# Patient Record
Sex: Male | Born: 1935 | Race: White | Hispanic: No | Marital: Married | Smoking: Never smoker
Health system: Southern US, Community
[De-identification: ages and names within clinical notes are randomized; demographics above are authoritative.]

## PROBLEM LIST (undated history)

## (undated) DIAGNOSIS — E119 Type 2 diabetes mellitus without complications: Secondary | ICD-10-CM

---

## 1994-12-29 HISTORY — PX: CORONARY ARTERY BYPASS GRAFT: SHX141

## 2017-02-16 ENCOUNTER — Encounter (HOSPITAL_COMMUNITY): Payer: Self-pay

## 2017-02-16 ENCOUNTER — Emergency Department (HOSPITAL_COMMUNITY): Payer: Medicare Other

## 2017-02-16 ENCOUNTER — Emergency Department (HOSPITAL_COMMUNITY)
Admission: EM | Admit: 2017-02-16 | Discharge: 2017-02-16 | Disposition: A | Discharge status: Home / Self Care | Payer: Medicare Other | Attending: Emergency Medicine | Admitting: Emergency Medicine

## 2017-02-16 DIAGNOSIS — Z7982 Long term (current) use of aspirin: Secondary | ICD-10-CM | POA: Diagnosis not present

## 2017-02-16 DIAGNOSIS — Z794 Long term (current) use of insulin: Secondary | ICD-10-CM | POA: Diagnosis not present

## 2017-02-16 DIAGNOSIS — Z951 Presence of aortocoronary bypass graft: Secondary | ICD-10-CM | POA: Diagnosis not present

## 2017-02-16 DIAGNOSIS — R55 Syncope and collapse: Secondary | ICD-10-CM

## 2017-02-16 DIAGNOSIS — E119 Type 2 diabetes mellitus without complications: Secondary | ICD-10-CM | POA: Diagnosis not present

## 2017-02-16 DIAGNOSIS — Z79899 Other long term (current) drug therapy: Secondary | ICD-10-CM | POA: Diagnosis not present

## 2017-02-16 HISTORY — DX: Type 2 diabetes mellitus without complications: E11.9

## 2017-02-16 LAB — CBC WITH DIFFERENTIAL/PLATELET
BASOS PCT: 1 %
Basophils Absolute: 0.1 10*3/uL (ref 0.0–0.1)
EOS ABS: 0.7 10*3/uL (ref 0.0–0.7)
Eosinophils Relative: 7 %
HCT: 38.4 % — ABNORMAL LOW (ref 39.0–52.0)
HEMOGLOBIN: 13.2 g/dL (ref 13.0–17.0)
Lymphocytes Relative: 36 %
Lymphs Abs: 3.6 10*3/uL (ref 0.7–4.0)
MCH: 31.1 pg (ref 26.0–34.0)
MCHC: 34.4 g/dL (ref 30.0–36.0)
MCV: 90.6 fL (ref 78.0–100.0)
MONOS PCT: 9 %
Monocytes Absolute: 0.9 10*3/uL (ref 0.1–1.0)
NEUTROS PCT: 47 %
Neutro Abs: 4.7 10*3/uL (ref 1.7–7.7)
PLATELETS: 187 10*3/uL (ref 150–400)
RBC: 4.24 MIL/uL (ref 4.22–5.81)
RDW: 13.4 % (ref 11.5–15.5)
WBC: 9.9 10*3/uL (ref 4.0–10.5)

## 2017-02-16 LAB — COMPREHENSIVE METABOLIC PANEL
ALBUMIN: 3.8 g/dL (ref 3.5–5.0)
ALK PHOS: 39 U/L (ref 38–126)
ALT: 15 U/L — ABNORMAL LOW (ref 17–63)
ANION GAP: 13 (ref 5–15)
AST: 21 U/L (ref 15–41)
BUN: 11 mg/dL (ref 6–20)
CHLORIDE: 105 mmol/L (ref 101–111)
CO2: 22 mmol/L (ref 22–32)
Calcium: 9.5 mg/dL (ref 8.9–10.3)
Creatinine, Ser: 1.03 mg/dL (ref 0.61–1.24)
GFR calc Af Amer: >60 mL/min (ref >60)
GFR calc non Af Amer: >60 mL/min (ref >60)
GLUCOSE: 136 mg/dL — AB (ref 65–99)
POTASSIUM: 3.2 mmol/L — AB (ref 3.5–5.1)
SODIUM: 140 mmol/L (ref 135–145)
Total Bilirubin: 1.8 mg/dL — ABNORMAL HIGH (ref 0.3–1.2)
Total Protein: 6.8 g/dL (ref 6.5–8.1)

## 2017-02-16 LAB — URINALYSIS, ROUTINE W REFLEX MICROSCOPIC
BACTERIA UA: NONE SEEN
BILIRUBIN URINE: NEGATIVE
GLUCOSE, UA: NEGATIVE mg/dL
Ketones, ur: 5 mg/dL — AB
NITRITE: NEGATIVE
PROTEIN: NEGATIVE mg/dL
Specific Gravity, Urine: 1.01 (ref 1.005–1.030)
Squamous Epithelial / LPF: NONE SEEN
pH: 5 (ref 5.0–8.0)

## 2017-02-16 LAB — TROPONIN I
Troponin I: <0.03 ng/mL (ref <0.03)
Troponin I: <0.03 ng/mL (ref <0.03)

## 2017-02-16 LAB — CBG MONITORING, ED
GLUCOSE-CAPILLARY: 127 mg/dL — AB (ref 65–99)
Glucose-Capillary: 117 mg/dL — ABNORMAL HIGH (ref 65–99)

## 2017-02-16 MED ORDER — ONDANSETRON HCL 4 MG/2ML IJ SOLN
4.0000 mg | Freq: Once | INTRAMUSCULAR | Status: AC
  Administered 2017-02-16: 4 mg via INTRAVENOUS
  Filled 2017-02-16: qty 2

## 2017-02-16 MED ORDER — SODIUM CHLORIDE 0.9 % IV BOLUS (SEPSIS)
1000.0000 mL | Freq: Once | INTRAVENOUS | Status: AC
  Administered 2017-02-16: 1000 mL via INTRAVENOUS

## 2017-02-16 MED ORDER — SODIUM CHLORIDE 0.9 % IV SOLN
INTRAVENOUS | Status: DC
  Administered 2017-02-16: 125 mL/h via INTRAVENOUS

## 2017-02-16 MED ORDER — POTASSIUM CHLORIDE CRYS ER 20 MEQ PO TBCR: 20.0000 meq | EXTENDED_RELEASE_TABLET | Freq: Two times a day (BID) | ORAL | 0 refills | Status: AC

## 2017-02-16 MED ORDER — POTASSIUM CHLORIDE CRYS ER 20 MEQ PO TBCR
40.0000 meq | EXTENDED_RELEASE_TABLET | Freq: Once | ORAL | Status: AC
  Administered 2017-02-16: 40 meq via ORAL
  Filled 2017-02-16: qty 2

## 2017-02-16 NOTE — ED Provider Notes (Signed)
[facility]-EMERGENCY DEPT Provider Note   CSN: [id number] Arrival date & time: 02/16/17  0747     History   Chief Complaint Chief Complaint  Patient presents with  . Loss of Consciousness    HPI Erik Hall is a 81 y.o. male.  HPI  81 yo M here with syncope. Has h/o DM, CAD s/p CABG X 3 in late 90's. Was here for wife's knee surgery, did not sleep much last night, did not eat breakfast this AM and went to sit in a chair and fell on bottom and hit his head. Was able to stand up, did fine but subsequently syncopized. CRNA checked carotid pulse and it was rapid/irregular. Patient was diaphroetic, pale and clammy. CBG was 117. Patient returned to normal LOC and was nauseous and diaphoretic which has continued even upon arrival here.  He does not have any recent illnesses, chest pain, sob, blood in stool. No pain or obvious injuries from fall. No history of syncope or irregular heart rate.   Past Medical History:  Diagnosis Date  . Diabetes mellitus without complication (HCC)     There are no active problems to display for this patient.   Past Surgical History:  Procedure Laterality Date  . CORONARY ARTERY BYPASS GRAFT  1996   3x       Home Medications    Prior to Admission medications   Medication Sig Start Date End Date Taking? Authorizing Provider  aspirin EC 81 MG tablet Take 81 mg by mouth daily.   Yes Historical Provider, MD  diphenoxylate-atropine (LOMOTIL) 2.5-0.025 MG tablet Take 1 tablet by mouth every 6 (six) hours as needed for diarrhea or loose stools.   Yes Historical Provider, MD  LEVEMIR 100 UNIT/ML injection Inject 6 Units into the skin at bedtime. 12/31/16  Yes Historical Provider, MD  lisinopril (PRINIVIL,ZESTRIL) 5 MG tablet Take 5 mg by mouth daily. 01/10/17  Yes Historical Provider, MD  metFORMIN (GLUCOPHAGE) 500 MG tablet Take 1,000 mg by mouth at bedtime.   Yes Historical Provider, MD  NOVOLOG MIX 70/30 (70-30) 100 UNIT/ML injection Inject 15-25 Units into  the skin 2 (two) times daily. 15 units in the morning, 25 units  before dinner 01/15/17  Yes Historical Provider, MD  simvastatin (ZOCOR) 20 MG tablet Take 20 mg by mouth daily.   Yes Historical Provider, MD  potassium chloride SA (K-DUR,KLOR-CON) 20 MEQ tablet Take 1 tablet (20 mEq total) by mouth 2 (two) times daily. 02/16/17   Marily Memos, MD    Family History History reviewed. No pertinent family history.  Social History Social History  Substance Use Topics  . Smoking status: Never Smoker  . Smokeless tobacco: Never Used  . Alcohol use No     Allergies   Patient has no known allergies.   Review of Systems Review of Systems  Respiratory: Negative for cough and shortness of breath.   Cardiovascular: Negative for chest pain, palpitations and leg swelling.  Gastrointestinal: Negative for abdominal pain.  Neurological: Positive for syncope.  All other systems reviewed and are negative.    Physical Exam Updated Vital Signs BP 136/81 (BP Location: Right Arm)   Pulse 70   Temp 97.8 F (36.6 C) (Oral)   Resp 10   Ht 6\' 1"  (1.854 m)   Wt 195 lb (88.5 kg)   SpO2 98%   BMI 25.73 kg/m   Physical Exam  Constitutional: He is oriented to person, place, and time. He appears well-developed and well-nourished.  HENT:  Head:  Normocephalic and atraumatic.  Eyes: Conjunctivae and EOM are normal.  Neck: Normal range of motion.  Cardiovascular: Normal rate.   Pulmonary/Chest: Effort normal. No respiratory distress.  Abdominal: Soft. He exhibits no distension. There is no tenderness. There is no guarding.  Musculoskeletal: Normal range of motion. He exhibits no edema or deformity.  No cervical spine tenderness, thoracic spine tenderness or Lumbar spine tenderness.  No tenderness or pain with palpation and full ROM of all joints in upper and lower extremities.  No ecchymosis or other signs of trauma on back or extremities.  No Pain with AP or lateral compression of ribs.  No  Paracervical ttp, paraspinal ttp   Neurological: He is alert and oriented to person, place, and time. He displays normal reflexes. No cranial nerve deficit. Coordination normal.  No altered mental status, able to give full seemingly accurate history.  Face is symmetric, EOM's intact, pupils equal and reactive, vision intact, tongue and uvula midline without deviation Upper and Lower extremity motor 5/5, intact pain perception in distal extremities, 2+ reflexes in biceps, patella and achilles tendons. Finger to nose normal, heel to shin normal.   Skin: He is diaphoretic.  Nursing note and vitals reviewed.    ED Treatments / Results  Labs (all labs ordered are listed, but only abnormal results are displayed) Labs Reviewed  CBC WITH DIFFERENTIAL/PLATELET - Abnormal; Notable for the following:       Result Value   HCT 38.4 (*)    All other components within normal limits  COMPREHENSIVE METABOLIC PANEL - Abnormal; Notable for the following:    Potassium 3.2 (*)    Glucose, Bld 136 (*)    ALT 15 (*)    Total Bilirubin 1.8 (*)    All other components within normal limits  URINALYSIS, ROUTINE W REFLEX MICROSCOPIC - Abnormal; Notable for the following:    Hgb urine dipstick SMALL (*)    Ketones, ur 5 (*)    Leukocytes, UA TRACE (*)    All other components within normal limits  CBG MONITORING, ED - Abnormal; Notable for the following:    Glucose-Capillary 127 (*)    All other components within normal limits  CBG MONITORING, ED - Abnormal; Notable for the following:    Glucose-Capillary 117 (*)    All other components within normal limits  TROPONIN I  TROPONIN I  POCT CBG (FASTING - GLUCOSE)-MANUAL ENTRY    EKG  EKG Interpretation  Date/Time:  Monday February 16 2017 07:54:11 EST Ventricular Rate:  64 PR Interval:    QRS Duration: 94 QT Interval:  435 QTC Calculation: 449 R Axis:   -7 Text Interpretation:  Sinus rhythm RSR' in V1 or V2, right VCD or RVH Borderline T  abnormalities, anterior leads No old tracing to compare Confirmed by [facility] MD, Elzie Sheets [phone]) on 02/16/2017 8:09:14 AM       Radiology Dg Chest 2 View  Result Date: 02/16/2017 CLINICAL DATA:  Syncopal episode this morning with subsequent fall striking the posterior calvarium. Former smoker. Prior CABG. EXAM: CHEST  2 VIEW COMPARISON:  None in PACs FINDINGS: The lungs are adequately inflated and clear. The heart and pulmonary vascularity are normal. The sternal wires are intact. There is no pleural effusion. The bony thorax exhibits no acute abnormality. There are mild degenerative changes of both shoulders. IMPRESSION: There is no active cardiopulmonary disease. Electronically Signed   By: David  [place] M.D.   On: 02/16/2017 08:43   Ct Head Wo Contrast  Result Date:  02/16/2017 CLINICAL DATA:  81 year old diabetic male with episode of dizziness and subsequent syncope striking occiput. Initial encounter. EXAM: CT HEAD WITHOUT CONTRAST TECHNIQUE: Contiguous axial images were obtained from the base of the skull through the vertex without intravenous contrast. COMPARISON:  None. FINDINGS: Brain: Prominent chronic microvascular changes without CT evidence of large acute infarct. No intracranial hemorrhage. Global atrophy. Ventricular prominence probably related to atrophy rather than hydrocephalus. Prominent falx calcification. Vascular: Vascular calcifications. Skull: No skull fracture. Sinuses/Orbits: Post lens replacement. Globes appear to be grossly intact. Fluid level left maxillary sinus. No obvious orbital floor fracture. Mucosal thickening right maxillary sinus with bone thickening suggesting changes of chronic sinusitis. Partial opacification sphenoid sinuses greater on the left. Mild mucosal thickening ethmoid sinus air cells. Other: Negative IMPRESSION: No skull fracture or intracranial hemorrhage. Chronic microvascular changes without CT evidence of large acute infarct. Atrophy. Paranasal sinus  opacification as discussed above. Electronically Signed   By: Lacy DuverneySteven  Olson M.D.   On: 02/16/2017 09:00    Procedures Procedures (including critical care time)  Medications Ordered in ED Medications  sodium chloride 0.9 % bolus 1,000 mL (0 mLs Intravenous Stopped 02/16/17 0950)    And  0.9 %  sodium chloride infusion ( Intravenous Stopped 02/16/17 1413)  ondansetron (ZOFRAN) injection 4 mg (4 mg Intravenous Given 02/16/17 0858)  potassium chloride SA (K-DUR,KLOR-CON) CR tablet 40 mEq (40 mEq Oral Given 02/16/17 1119)     Initial Impression / Assessment and Plan / ED Course  I have reviewed the triage vital signs and the nursing notes.  Pertinent labs & imaging results that were available during my care of the patient were reviewed by me and considered in my medical decision making (see chart for details).     81 year old with the episode of syncope. He has had. Sounds like somebody check a pulse and thought it was rapid and irregular. But patient has no history of atrial fibrillation. Patient observed in the emergency department while 5 hours on cardiac monitoring without any abnormal heart rate. He did have slightly low potassium but his EKG didn't show any changes associated with it. Rest his workup was negative as well for causes of syncope. It could've been just a concussion versus less likely arrhythmia. Could be related to structural heart issue but it is also unlikely without any physical exam findings. Plan for PCP follow-up for echocardiogram and consideration of Holter monitor.    Final Clinical Impressions(s) / ED Diagnoses   Final diagnoses:  Syncope and collapse    New Prescriptions Discharge Medication List as of 02/16/2017  1:54 PM    START taking these medications   Details  potassium chloride SA (K-DUR,KLOR-CON) 20 MEQ tablet Take 1 tablet (20 mEq total) by mouth 2 (two) times daily., Starting Mon 02/16/2017, Print         Marily MemosJason Jazmin Ley, MD 02/16/17 1450

## 2017-02-16 NOTE — ED Notes (Signed)
Attempted to obtain orthostatic vitals, pt had complaints of feeling dizzy and nauseous.

## 2017-02-16 NOTE — ED Triage Notes (Signed)
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 Pt. Went to sit down and fell-hit his head, but told staff he was fine. Pt. Then sat in chair, pulse became rapid, then patient went unresponsive ~45min. Pt. Placed in bed and hooked to monitor. Staff states CBG 117 and HR irregular. Pt. States he has not eaten and is DM. EDP at bedside.

## 2017-02-16 NOTE — ED Notes (Signed)
Patient transported to X-ray 

## 2017-02-16 NOTE — ED Notes (Signed)
Pt ambulated in hallway and to restroom. Pt stated feeling a little weak but had no other complaints.

## 2017-02-16 NOTE — ED Notes (Signed)
Pt stated he would feel more comfortable waiting to Ambulate in hallway due to feeling nauseous from orthostatic vitals.

## 2017-02-16 NOTE — ED Notes (Signed)
Clicked off CT wo contrast on accident.

## 2017-02-16 NOTE — ED Notes (Signed)
OPt no longer diaphoretic or nauseated. Lab at bedside for repeat troponin.

## 2017-02-16 NOTE — ED Notes (Signed)
Went in to obtain urine sample, pt stated he could no void at this time. Left urinal at bedside.

## 2017-02-16 NOTE — ED Notes (Signed)
Patient states he understands instructions. Home stable with steADY GAIT.

## 2018-09-25 IMAGING — CT CT HEAD W/O CM
3 of 4 series · 15 of 47 positions shown, 18 images · non-contrast
Comparison: None.

CLINICAL DATA: 80-year-old diabetic male with episode of dizziness
and subsequent syncope striking occiput. Initial encounter.

EXAM:
CT HEAD WITHOUT CONTRAST
TECHNIQUE: Contiguous axial images were obtained from the base of the skull
through the vertex without intravenous contrast.

[Series 4: head 2.0 h70h · axial · 0.48mm/px · z∈[-116,+10]mm · 9 of 79 slices shown, 12 images]
[im 8/79  brain]
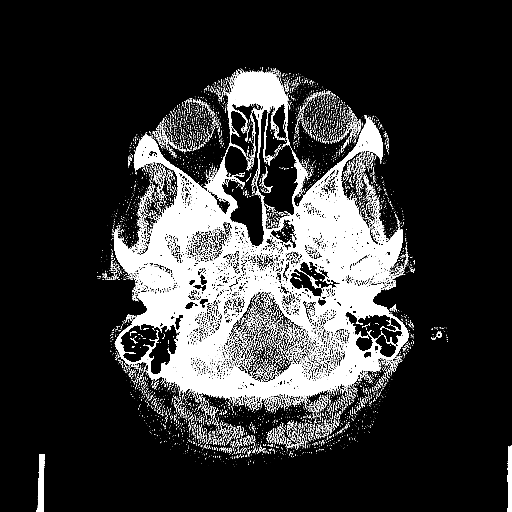
[im 8/79  bone]
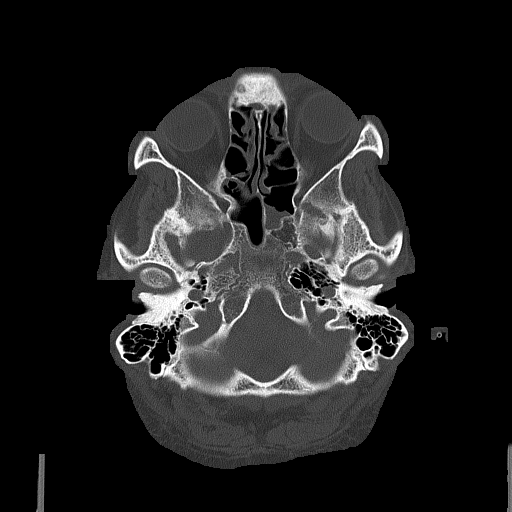
[im 16/79  brain]
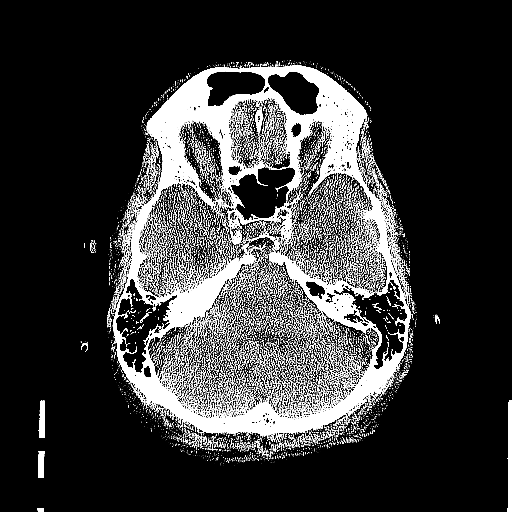
[im 24/79  brain]
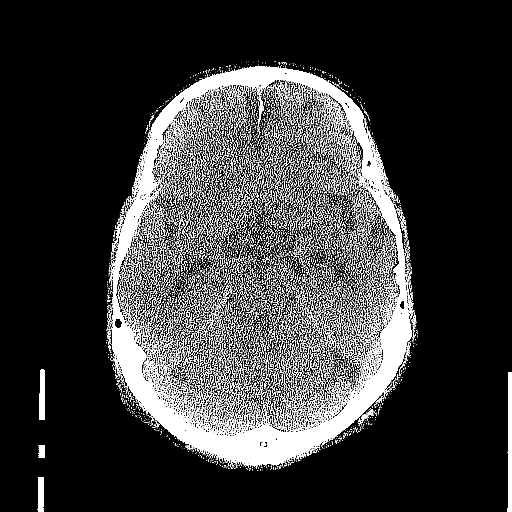
[im 32/79  brain]
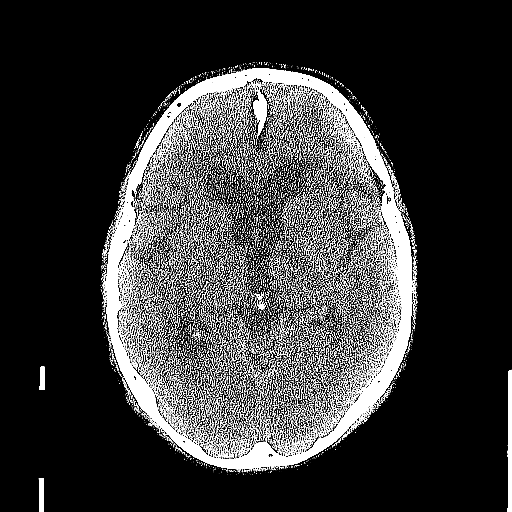
[im 40/79  brain]
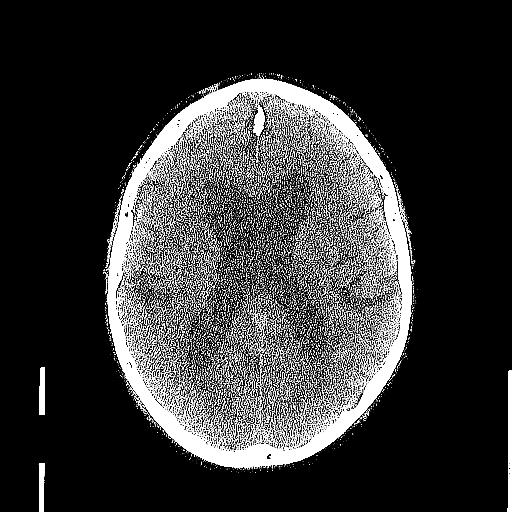
[im 40/79  bone]
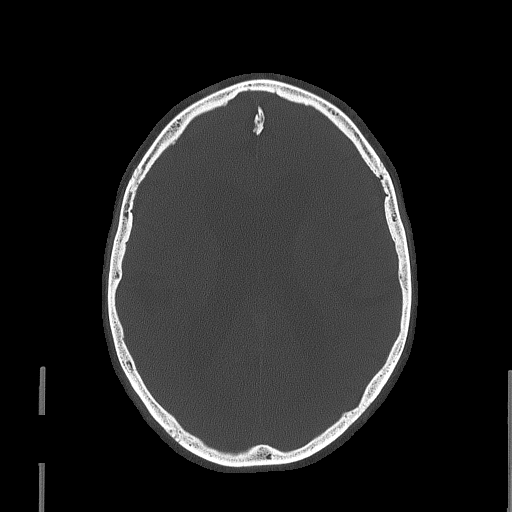
[im 47/79  brain]
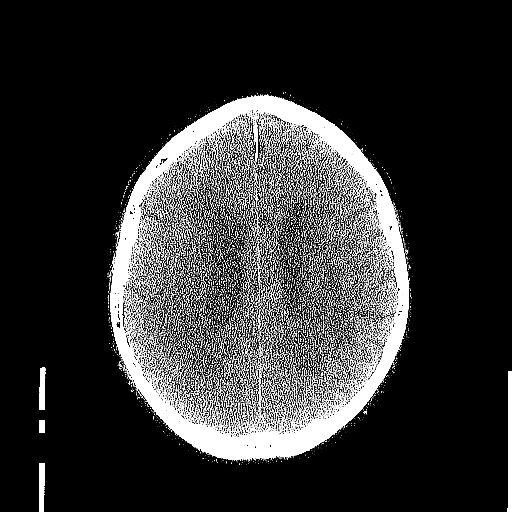
[im 55/79  brain]
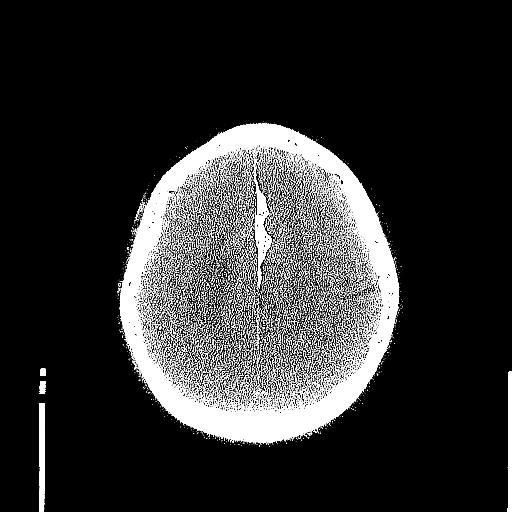
[im 63/79  brain]
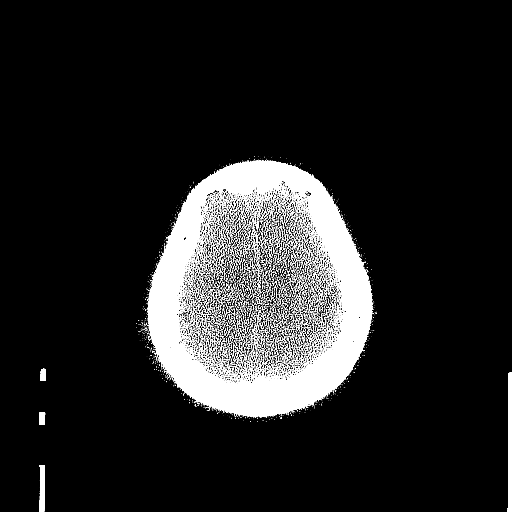
[im 71/79  brain]
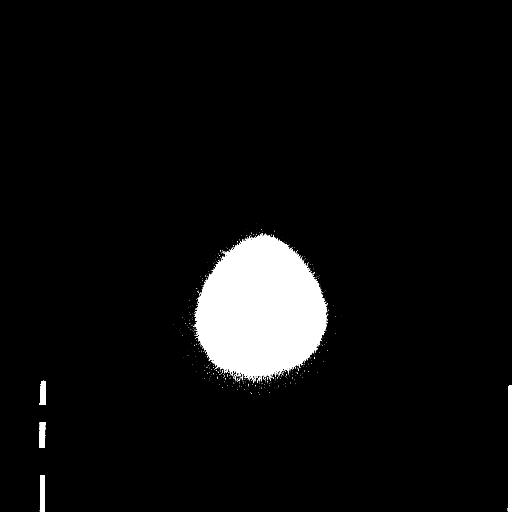
[im 71/79  bone]
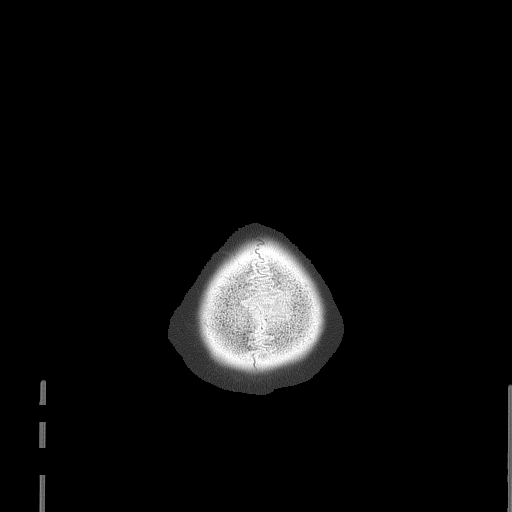

[Series 5: head 3.0 mpr cor · coronal · 0.34mm/px · 3 of 67 slices shown]
[im 23/67  brain]
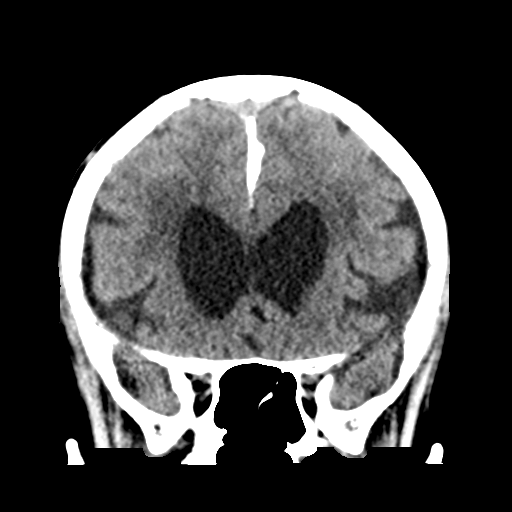
[im 30/67  brain]
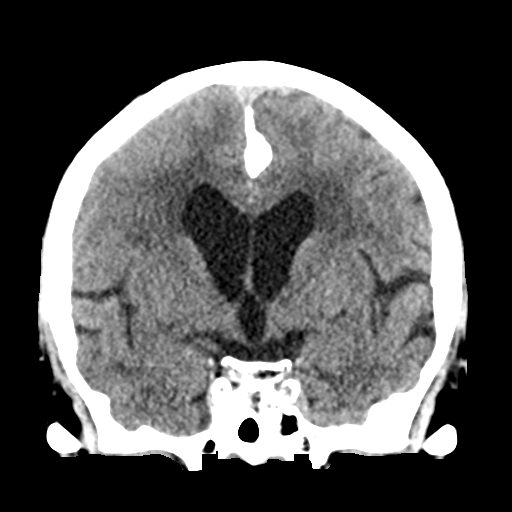
[im 37/67  brain]
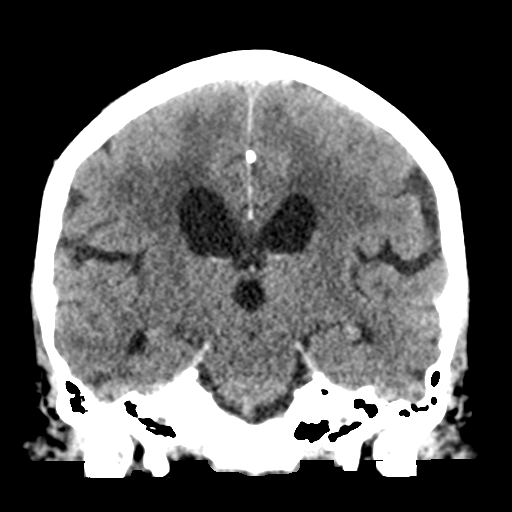

[Series 6: head 3.0 mpr sag · sagittal · 0.31mm/px · 3 of 66 slices shown]
[im 22/66  brain]
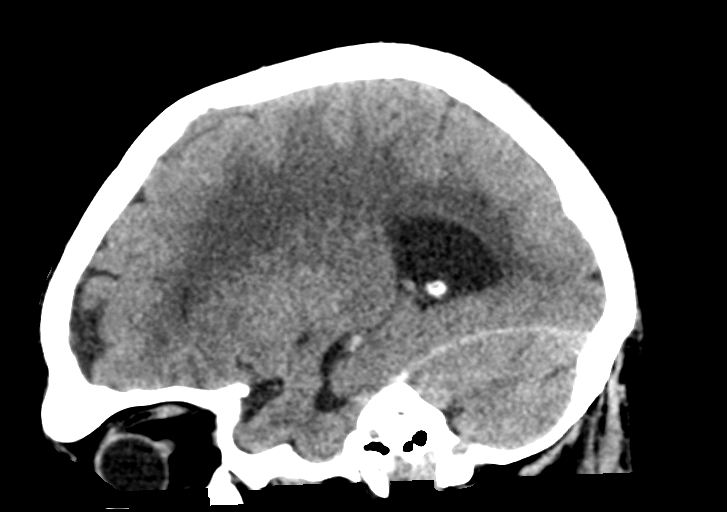
[im 33/66  brain]
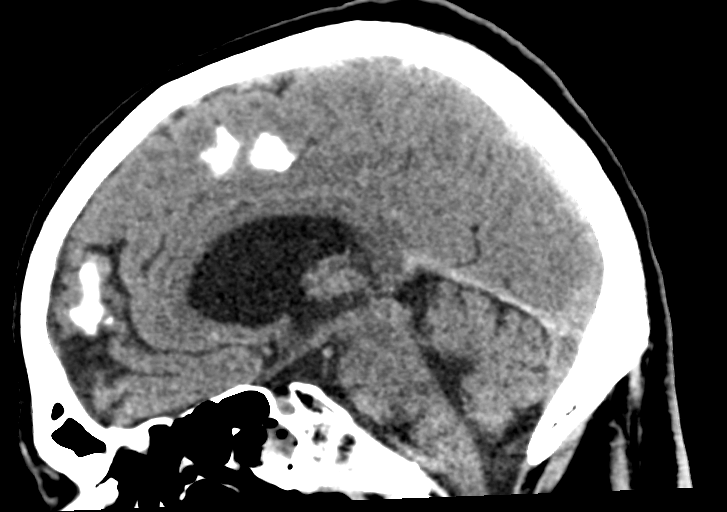
[im 44/66  brain]
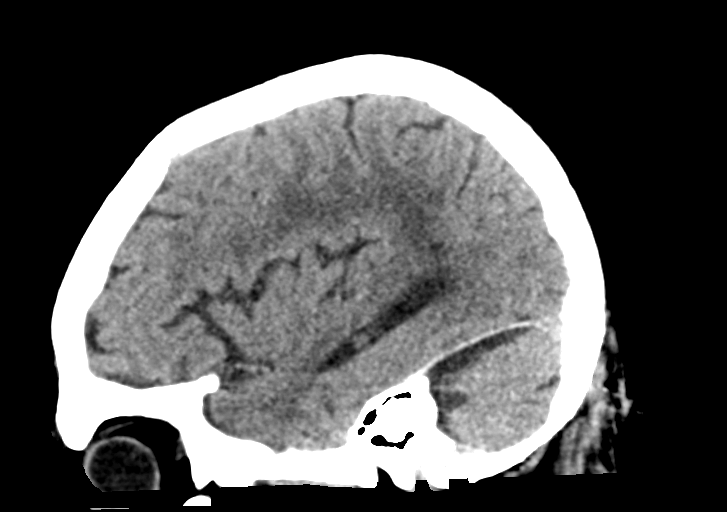

[15 of 47 positions shown; findings below may reference images not displayed]

FINDINGS: Brain: Prominent chronic microvascular changes without CT evidence
of large acute infarct.

No intracranial hemorrhage.

Global atrophy. Ventricular prominence probably related to atrophy
rather than hydrocephalus.

Prominent falx calcification.

Vascular: Vascular calcifications.

Skull: No skull fracture.

Sinuses/Orbits: Post lens replacement. Globes appear to be grossly
intact.

Fluid level left maxillary sinus. No obvious orbital floor fracture.
Mucosal thickening right maxillary sinus with bone thickening
suggesting changes of chronic sinusitis. Partial opacification
sphenoid sinuses greater on the left. Mild mucosal thickening
ethmoid sinus air cells.

Other: Negative
IMPRESSION: No skull fracture or intracranial hemorrhage.

Chronic microvascular changes without CT evidence of large acute
infarct.

Atrophy.

Paranasal sinus opacification as discussed above.
